# Patient Record
Sex: Male | Born: 1995 | Race: White | Hispanic: No | Marital: Single | Smoking: Current every day smoker
Health system: Southern US, Community
[De-identification: ages and names within clinical notes are randomized; demographics above are authoritative.]

## PROBLEM LIST (undated history)

## (undated) DIAGNOSIS — F419 Anxiety disorder, unspecified: Secondary | ICD-10-CM

## (undated) DIAGNOSIS — F909 Attention-deficit hyperactivity disorder, unspecified type: Secondary | ICD-10-CM

## (undated) HISTORY — DX: Anxiety disorder, unspecified: F41.9

## (undated) HISTORY — DX: Attention-deficit hyperactivity disorder, unspecified type: F90.9

---
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
## 2014-02-27 ENCOUNTER — Other Ambulatory Visit (HOSPITAL_COMMUNITY): Payer: Self-pay | Admitting: Psychiatry

## 2014-08-04 ENCOUNTER — Emergency Department
Admission: EM | Admit: 2014-08-04 | Discharge: 2014-08-04 | Disposition: A | Discharge status: Home / Self Care | Payer: BC Managed Care – PPO | Source: Home / Self Care | Attending: Emergency Medicine | Admitting: Emergency Medicine

## 2014-08-04 ENCOUNTER — Ambulatory Visit (HOSPITAL_COMMUNITY): Payer: Self-pay | Admitting: Psychiatry

## 2014-08-04 ENCOUNTER — Encounter: Payer: Self-pay | Admitting: *Deleted

## 2014-08-04 DIAGNOSIS — F3340 Major depressive disorder, recurrent, in remission, unspecified: Secondary | ICD-10-CM

## 2014-08-04 MED ORDER — LAMOTRIGINE 200 MG PO TABS: ORAL_TABLET | ORAL | Status: DC

## 2014-08-04 NOTE — ED Provider Notes (Addendum)
CSN: [id number]     Arrival date & time 08/04/14  1719 History   First MD Initiated Contact with Patient 08/04/14 1721     Chief Complaint  Patient presents with  . Medication Refill   The history is provided by the patient (and father).  Stephen Kidd is a 18 year-old male, brought in by his father,  [facility], Friday 08/04/14  5:19 PM.  HPI: Special circumstances note: Patient has a history of major depressive disorder/prior substance abuse,and he has been incarcerated this year, and just was accepted into [place] 24 month [place] [facility] by [employer].  I was contacted earlier this afternoon by staff at [facility]. It was explained to me that [facility] had [facility],urgently needing medication refill and forms completed in order to meet stringent legal requirements to enter rehab program tomorrow, and if pt does not meet those requirements, then he would be required to return to incarceration. It was further explained to me (by [facility] staff) the logistical problem that patient would not be able to arrive in time to [facility] during their office hours today, and there was no psychiatrist available after hours to do this.--Therefore, in this special, urgent situation, I was requested to see patient in urgent care, refill his medication, and complete forms.   I carefully reviewed his  EMR and the most recent notes in the EMR, last seen by Dr. Laury DeepPuthuvel for major depressive disorder 09/23/13. Based on the EMR, phone conversation with Selena BattenKim at behavioral health, and careful history taking with patient and father, the following is a summary of his medication history this past year: As of 09/23/2013, he was prescribed lithium, Zoloft, and Tenex, but over a course of months, Lamictal was added in low dose, and gradually increased, and he was gradually weaned off of  Lithium, Zoloft, and  Tenex.  I questioned patient and father several times in different ways, and they confirmed each time that patient is currently taking Lamictal  200 mg twice a day.  Now that he has been on monotherapy with Lamictal 200 mg BID for several months during his incarceration, he is doing well without side effects. He feels his mood and concentration are much improved on this regimen without side effects. He denies any rash, focal neurologic symptoms or any side effects. See review of systems below.  As Stephen Kidd gives his history, his thoughts sound organized and his speech is articulate. Stephen Kidd's father's behavior here is appropriate. Father is helpful with history, is articulate, and appears caring and very concerned for his son's well-being.  Patient has had screening and monitoring blood tests in the past, including initial CBC, CMP and TSH which were all within normal limits,and follow-up CMP and CBC within normal limits on medication.--father and patient declined any blood tests today.  Past Medical History  Diagnosis Date  . ADHD (attention deficit hyperactivity disorder)   . Anxiety       Major Depressive Disorder  History reviewed. No pertinent past surgical history. Family History  Problem Relation Age of Onset  . Diabetes Father   . Depression Sister   . Cancer Paternal Grandmother     hx. of breast cancer  . Depression Mother   . Anxiety disorder Mother   . Stroke Paternal Grandfather   . Transient ischemic attack Paternal Grandfather   . Alcohol abuse Neg Hx   . Physical abuse Neg Hx   .  Bipolar disorder Neg Hx    History  Substance Use Topics  . Smoking status: Current Every Day Smoker -- 0.50 packs/day    Types: Cigarettes  . Smokeless tobacco: Never Used  . Alcohol Use: No     Comment: 4 beers    Review of Systems  Constitutional: Negative for fever.  HENT: Negative.   Eyes: Negative.  Negative for visual disturbance.  Respiratory: Negative.   Cardiovascular:  Negative.   Gastrointestinal: Negative.   Genitourinary: Negative.   Skin: Negative for rash.  Neurological: Negative.  Negative for dizziness, seizures and syncope.  Psychiatric/Behavioral: Negative for suicidal ideas, hallucinations, confusion and self-injury.       No homicidal or suicidal ideation  All other systems reviewed and are negative.   Allergies  Poison ivy treatments  Home Medications   Prior to Admission medications   Medication Sig Start Date End Date Taking? Authorizing Provider  lamoTRIgine (LAMICTAL) 200 MG tablet Take 2 tablets by mouth daily 08/04/14   Lajean Manesavid Massey, MD   BP 133/81 mmHg  Pulse 101  Resp 14  Ht 5\' 7"  (1.702 m)  Wt 164 lb (74.39 kg)  BMI 25.68 kg/m2  SpO2 97% Physical Exam  Constitutional: He is oriented to person, place, and time. He appears well-developed and well-nourished. No distress.  Alert, cooperative male, no distress. Pulse repeated, 92, regular  HENT:  Head: Normocephalic and atraumatic.  Eyes: Conjunctivae and EOM are normal. Pupils are equal, round, and reactive to light. No scleral icterus.  Neck: Normal range of motion.  Cardiovascular: Normal rate.   Pulmonary/Chest: Effort normal.  Abdominal: He exhibits no distension.  Musculoskeletal: Normal range of motion.  Neurological: He is alert and oriented to person, place, and time. No cranial nerve deficit. Coordination and gait normal.  Skin: Skin is warm. No rash noted.  Psychiatric: He has a normal mood and affect. His speech is normal and behavior is normal. Thought content normal. Thought content is not delusional. Cognition and memory are normal. He expresses no homicidal and no suicidal ideation.  Nursing note and vitals reviewed.   ED Course  Procedures   MDM   1. Major depressive disorder, recurrent, in remission   Over 40 minutes spent, greater than 50% of the time spent for counseling and coordination of care. Risks, benefits, alternatives discussed at length  with patient and father. Precautions discussed. They voiced understanding and agreement with the following plan:  I carefully reviewed and discussed with patient and father the 2 forms they bring in from [employer], entitled "Medication Guidelines" and "Self Administration Order", which we will scan into patient's EMR. Requirements include: "standing order from the prescribing doctor( that exactly matches the Rx label on the bottle)",... "...frequency of dose needs to read daily, NOT twice daily" .........they require "a 90 day supply of the medication and a 4155-month prescription..."  I therefore printed and gave them a prescription for a 90 day supply: lamoTRIgine (LAMICTAL) 200 MG tablet Take 2 tablets by mouth daily 180 tablet   With 3 (Three) additional refills. I included in the prescription comments: "for any questions or concerns about this prescription, please contact behavioral [facility] at [phone]"  Please see detailed discussion in HPI, and patient confirmed that he is to take the Lamictal  200 mg twice a day, as he is currently taking. I completed, signed, and gave them the "Self Administration Order" sheet.  Patient and father voiced understanding that Stephen Kidd needs to follow up with  psychiatrist at behavioral health, [facility] within 3 to 6 months, or sooner if any questions,problems or concerns. We will forward a copy of this note and forms to the psychiatry staff at behavioral health, [facility].                Lajean Manes, MD 08/05/14 [phone]

## 2014-08-04 NOTE — ED Notes (Signed)
Pt is in need of medication refill for Lamictal. Dr. Georgina [facility]illionMassey [facility] and ok 'd visit.

## 2014-09-07 ENCOUNTER — Encounter (HOSPITAL_COMMUNITY): Payer: Self-pay

## 2014-09-07 ENCOUNTER — Emergency Department (HOSPITAL_COMMUNITY)
Admission: EM | Admit: 2014-09-07 | Discharge: 2014-09-07 | Disposition: A | Discharge status: Home / Self Care | Payer: BLUE CROSS/BLUE SHIELD | Attending: Emergency Medicine | Admitting: Emergency Medicine

## 2014-09-07 ENCOUNTER — Emergency Department (HOSPITAL_COMMUNITY): Payer: BLUE CROSS/BLUE SHIELD

## 2014-09-07 DIAGNOSIS — G43109 Migraine with aura, not intractable, without status migrainosus: Secondary | ICD-10-CM | POA: Diagnosis not present

## 2014-09-07 DIAGNOSIS — Z720 Tobacco use: Secondary | ICD-10-CM | POA: Diagnosis not present

## 2014-09-07 DIAGNOSIS — R51 Headache: Secondary | ICD-10-CM | POA: Diagnosis present

## 2014-09-07 DIAGNOSIS — R519 Headache, unspecified: Secondary | ICD-10-CM

## 2014-09-07 DIAGNOSIS — Z79899 Other long term (current) drug therapy: Secondary | ICD-10-CM | POA: Insufficient documentation

## 2014-09-07 DIAGNOSIS — F419 Anxiety disorder, unspecified: Secondary | ICD-10-CM | POA: Diagnosis not present

## 2014-09-07 DIAGNOSIS — H539 Unspecified visual disturbance: Secondary | ICD-10-CM

## 2014-09-07 MED ORDER — METOCLOPRAMIDE HCL 5 MG/ML IJ SOLN
10.0000 mg | Freq: Once | INTRAMUSCULAR | Status: AC
  Administered 2014-09-07: 10 mg via INTRAVENOUS
  Filled 2014-09-07: qty 2

## 2014-09-07 MED ORDER — SODIUM CHLORIDE 0.9 % IV BOLUS (SEPSIS)
1000.0000 mL | Freq: Once | INTRAVENOUS | Status: AC
Start: 2014-09-07 — End: 2014-09-07
  Administered 2014-09-07: 1000 mL via INTRAVENOUS

## 2014-09-07 MED ORDER — IBUPROFEN 800 MG PO TABS: 800.0000 mg | ORAL_TABLET | Freq: Three times a day (TID) | ORAL | Status: DC | PRN

## 2014-09-07 MED ORDER — DIPHENHYDRAMINE HCL 50 MG/ML IJ SOLN
50.0000 mg | Freq: Once | INTRAMUSCULAR | Status: AC
  Administered 2014-09-07: 50 mg via INTRAVENOUS
  Filled 2014-09-07: qty 1

## 2014-09-07 MED ORDER — KETOROLAC TROMETHAMINE 30 MG/ML IJ SOLN
30.0000 mg | Freq: Once | INTRAMUSCULAR | Status: AC
  Administered 2014-09-07: 30 mg via INTRAVENOUS
  Filled 2014-09-07: qty 1

## 2014-09-07 MED ORDER — DEXAMETHASONE SODIUM PHOSPHATE 4 MG/ML IJ SOLN
10.0000 mg | Freq: Once | INTRAMUSCULAR | Status: AC
  Administered 2014-09-07: 10 mg via INTRAVENOUS
  Filled 2014-09-07: qty 3

## 2014-09-07 NOTE — ED Notes (Signed)
Pt reports area behind right ear started to be painful to touch yesterday. No redness or abscess noted to area. Pt also reports intermittent blurred vision to right eyes starting today. Denies at this time. Neuro intact. NAD.
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
## 2014-09-07 NOTE — Discharge Instructions (Signed)
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
## 2014-09-07 NOTE — ED Notes (Signed)
Pt A&OX4, ambulatory at d/c with steady gait, NAD 

## 2014-09-15 ENCOUNTER — Encounter (HOSPITAL_COMMUNITY): Payer: Self-pay | Admitting: Neurology

## 2014-09-15 ENCOUNTER — Emergency Department (HOSPITAL_COMMUNITY)
Admission: EM | Admit: 2014-09-15 | Discharge: 2014-09-15 | Disposition: A | Discharge status: Home / Self Care | Payer: BLUE CROSS/BLUE SHIELD | Attending: Emergency Medicine | Admitting: Emergency Medicine

## 2014-09-15 ENCOUNTER — Emergency Department (HOSPITAL_COMMUNITY): Payer: BLUE CROSS/BLUE SHIELD

## 2014-09-15 DIAGNOSIS — R0602 Shortness of breath: Secondary | ICD-10-CM | POA: Diagnosis not present

## 2014-09-15 DIAGNOSIS — F419 Anxiety disorder, unspecified: Secondary | ICD-10-CM | POA: Insufficient documentation

## 2014-09-15 DIAGNOSIS — Z720 Tobacco use: Secondary | ICD-10-CM | POA: Insufficient documentation

## 2014-09-15 DIAGNOSIS — R55 Syncope and collapse: Secondary | ICD-10-CM | POA: Diagnosis not present

## 2014-09-15 DIAGNOSIS — R112 Nausea with vomiting, unspecified: Secondary | ICD-10-CM | POA: Insufficient documentation

## 2014-09-15 DIAGNOSIS — R0789 Other chest pain: Secondary | ICD-10-CM | POA: Diagnosis not present

## 2014-09-15 DIAGNOSIS — R000 Tachycardia, unspecified: Secondary | ICD-10-CM | POA: Diagnosis not present

## 2014-09-15 DIAGNOSIS — Z79899 Other long term (current) drug therapy: Secondary | ICD-10-CM | POA: Diagnosis not present

## 2014-09-15 DIAGNOSIS — R079 Chest pain, unspecified: Secondary | ICD-10-CM | POA: Diagnosis present

## 2014-09-15 LAB — BASIC METABOLIC PANEL
Anion gap: 6 (ref 5–15)
BUN: 8 mg/dL (ref 6–23)
CALCIUM: 9.5 mg/dL (ref 8.4–10.5)
CHLORIDE: 107 mmol/L (ref 96–112)
CO2: 26 mmol/L (ref 19–32)
Creatinine, Ser: 1 mg/dL (ref 0.50–1.35)
GFR calc Af Amer: >90 mL/min (ref >90)
GFR calc non Af Amer: >90 mL/min (ref >90)
Glucose, Bld: 99 mg/dL (ref 70–99)
POTASSIUM: 3.6 mmol/L (ref 3.5–5.1)
Sodium: 139 mmol/L (ref 135–145)

## 2014-09-15 LAB — URINALYSIS, ROUTINE W REFLEX MICROSCOPIC
Bilirubin Urine: NEGATIVE
Glucose, UA: NEGATIVE mg/dL
Hgb urine dipstick: NEGATIVE
Ketones, ur: NEGATIVE mg/dL
LEUKOCYTES UA: NEGATIVE
Nitrite: NEGATIVE
PH: 7 (ref 5.0–8.0)
PROTEIN: NEGATIVE mg/dL
SPECIFIC GRAVITY, URINE: 1.009 (ref 1.005–1.030)
Urobilinogen, UA: 0.2 mg/dL (ref 0.0–1.0)

## 2014-09-15 LAB — CBC WITH DIFFERENTIAL/PLATELET
BASOS ABS: 0 10*3/uL (ref 0.0–0.1)
BASOS PCT: 0 % (ref 0–1)
Eosinophils Absolute: 0 10*3/uL (ref 0.0–0.7)
Eosinophils Relative: 0 % (ref 0–5)
HCT: 44 % (ref 39.0–52.0)
Hemoglobin: 15.4 g/dL (ref 13.0–17.0)
Lymphocytes Relative: 12 % (ref 12–46)
Lymphs Abs: 1.3 10*3/uL (ref 0.7–4.0)
MCH: 30.2 pg (ref 26.0–34.0)
MCHC: 35 g/dL (ref 30.0–36.0)
MCV: 86.3 fL (ref 78.0–100.0)
MONOS PCT: 5 % (ref 3–12)
Monocytes Absolute: 0.5 10*3/uL (ref 0.1–1.0)
NEUTROS PCT: 83 % — AB (ref 43–77)
Neutro Abs: 9 10*3/uL — ABNORMAL HIGH (ref 1.7–7.7)
Platelets: 235 10*3/uL (ref 150–400)
RBC: 5.1 MIL/uL (ref 4.22–5.81)
RDW: 12.3 % (ref 11.5–15.5)
WBC: 10.9 10*3/uL — ABNORMAL HIGH (ref 4.0–10.5)

## 2014-09-15 LAB — I-STAT TROPONIN, ED: Troponin i, poc: 0 ng/mL (ref 0.00–0.08)

## 2014-09-15 LAB — D-DIMER, QUANTITATIVE (NOT AT ARMC): D-Dimer, Quant: <0.27 ug/mL-FEU (ref 0.00–0.48)

## 2014-09-15 MED ORDER — ONDANSETRON HCL 4 MG/2ML IJ SOLN
4.0000 mg | Freq: Once | INTRAMUSCULAR | Status: AC
  Administered 2014-09-15: 4 mg via INTRAVENOUS
  Filled 2014-09-15: qty 2

## 2014-09-15 MED ORDER — SODIUM CHLORIDE 0.9 % IV BOLUS (SEPSIS)
1000.0000 mL | Freq: Once | INTRAVENOUS | Status: AC
  Administered 2014-09-15: 1000 mL via INTRAVENOUS

## 2014-09-15 MED ORDER — ACETAMINOPHEN 500 MG PO TABS
1000.0000 mg | ORAL_TABLET | Freq: Once | ORAL | Status: AC
  Administered 2014-09-15: 1000 mg via ORAL
  Filled 2014-09-15: qty 2

## 2014-09-15 NOTE — ED Notes (Addendum)
Pt returned from xray

## 2014-09-15 NOTE — ED Provider Notes (Signed)
CSN: [id number]     Arrival date & time 09/15/14  7829 History   First MD Initiated Contact with Patient 09/15/14 0930     Chief Complaint  Patient presents with  . Chest Pain     (Consider location/radiation/quality/duration/timing/severity/associated sxs/prior Treatment) HPI Stephen Kidd is a 19 year old male with past medical history of ADHD, anxiety who presents the ER complaining of left upper chest pain and syncopal episode. Patient reports he was at work at the [employer], would be he became very stressed out, and angry. He states when he had these feelings he began to have sharp pains in his upper left chest, and began to have a headache. Patient states he shortly after passed out, and after waking up states he asked his friend to take him to the hospital. He denies any injury from his syncopal episode, and states he is still having persistent left upper "sharp" chest pain which is aggravated with deep inspiration and with palpation. He reports no alleviating factors. He reports mild associated nausea chest persisted, and reports his headache is in the posterior aspect of his head, and feels consistent with migraines he has had in the past. Patient states he has experienced complex migraines in the past. Patient reports also some associated generalized weakness. Patient states he is having similar chest pains in the past, and has been getting them over the past week intermittently. He states he typically notices a "fluttering sensation" when he lies down at night. Patient reports associated shortness of breath, which he states only has experienced just prior to his syncopal episode. Patient states the shortness of breath felt similar to previous anxiety attacks he has had in the past.  Past Medical History  Diagnosis Date  . ADHD (attention deficit hyperactivity disorder)   . Anxiety    History reviewed. No pertinent past surgical history. Family History  Problem Relation Age of  Onset  . Diabetes Father   . Depression Sister   . Cancer Paternal Grandmother     hx. of breast cancer  . Depression Mother   . Anxiety disorder Mother   . Stroke Paternal Grandfather   . Transient ischemic attack Paternal Grandfather   . Alcohol abuse Neg Hx   . Physical abuse Neg Hx   . Bipolar disorder Neg Hx    History  Substance Use Topics  . Smoking status: Current Every Day Smoker -- 0.50 packs/day    Types: Cigarettes  . Smokeless tobacco: Never Used  . Alcohol Use: No     Comment: 4 beers    Review of Systems  Constitutional: Negative for fever.  HENT: Negative for trouble swallowing.   Eyes: Negative for visual disturbance.  Respiratory: Positive for shortness of breath.   Cardiovascular: Positive for chest pain.  Gastrointestinal: Positive for nausea and vomiting. Negative for abdominal pain.  Genitourinary: Negative for dysuria.  Musculoskeletal: Negative for neck pain.  Skin: Negative for rash.  Neurological: Positive for syncope. Negative for dizziness, weakness and numbness.  Psychiatric/Behavioral: Negative.       Allergies  Poison ivy treatments  Home Medications   Prior to Admission medications   Medication Sig Start Date End Date Taking? Authorizing Provider  ARIPiprazole (ABILIFY) 10 MG tablet Take 10 mg by mouth daily.    Historical Provider, MD  doxepin (SINEQUAN) 50 MG capsule Take 50 mg by mouth at bedtime.    Historical Provider, MD  ibuprofen (ADVIL,MOTRIN) 800 MG tablet Take 1 tablet (800 mg total) by mouth every 8 (  eight) hours as needed for mild pain. 09/07/14   Kristen N Ward, DO  lamoTRIgine (LAMICTAL) 200 MG tablet Take 2 tablets by mouth daily 08/04/14   Lajean Manes, MD   BP 117/75 mmHg  Pulse 90  Temp(Src) 99.1 F (37.3 C) (Oral)  Resp 13  Ht  (1.727 m)  Wt 165 lb (74.844 kg)  BMI 25.09 kg/m2  SpO2 93% Physical Exam  Constitutional: He is oriented to person, place, and time. He appears well-developed and  well-nourished. No distress.  HENT:  Head: Normocephalic and atraumatic.  Mouth/Throat: Oropharynx is clear and moist. No oropharyngeal exudate.  Eyes: Right eye exhibits no discharge. Left eye exhibits no discharge. No scleral icterus.  Neck: Normal range of motion.  Cardiovascular: Normal rate, regular rhythm, S1 normal, S2 normal and normal heart sounds.   No murmur heard. Pulses:      Radial pulses are 2+ on the right side, and 2+ on the left side.  Tachycardic at 105 on exam  Pulmonary/Chest: Effort normal and breath sounds normal. No accessory muscle usage. No tachypnea. No respiratory distress.    Well-healed scar noted on upper left chest, which pain is reproducible in this region.  Abdominal: Soft. Normal appearance. There is no tenderness. There is no rigidity, no guarding, no tenderness at McBurney's point and negative Murphy's sign.  Musculoskeletal: Normal range of motion. He exhibits no edema or tenderness.  Neurological: He is alert and oriented to person, place, and time. He has normal strength. No cranial nerve deficit or sensory deficit. Coordination normal.  Patient fully alert, answering questions appropriately in full, clear sentences. Motor strength 5 out of 5 in all major muscle groups of upper and lower extremities. Radial nerves II through XII grossly intact. Distal sensation intact.  Skin: Skin is warm and dry. No rash noted. He is not diaphoretic.  Psychiatric: He has a normal mood and affect.  Nursing note and vitals reviewed.   ED Course  Procedures (including critical care time) Labs Review Labs Reviewed  CBC WITH DIFFERENTIAL/PLATELET - Abnormal; Notable for the following:    WBC 10.9 (*)    Neutrophils Relative % 83 (*)    Neutro Abs 9.0 (*)    All other components within normal limits  BASIC METABOLIC PANEL  URINALYSIS, ROUTINE W REFLEX MICROSCOPIC  D-DIMER, QUANTITATIVE  I-STAT TROPOININ, ED    Imaging Review Dg Chest 2 View  09/15/2014    CLINICAL DATA:  Syncopal episode at work today. Left-sided chest pain.  EXAM: CHEST  2 VIEW  COMPARISON:  None.  FINDINGS: The heart size and mediastinal contours are within normal limits. Both lungs are clear. The visualized skeletal structures are unremarkable.  IMPRESSION: Normal chest x-ray.   Electronically Signed   By: Loralie Champagne M.D.   On: 09/15/2014 10:59     EKG Interpretation   Date/Time:  Friday September 15 2014 09:23:33 EST Ventricular Rate:  103 PR Interval:  144 QRS Duration: 100 QT Interval:  324 QTC Calculation: 424 R Axis:   87 Text Interpretation:  Sinus tachycardia Right atrial enlargement  Incomplete right bundle branch block Borderline ECG No old tracing to  compare Confirmed by KOHUT  MD, STEPHEN ([id number]) on 09/15/2014 11:33:36 AM      MDM   Final diagnoses:  Chest wall discomfort    Patient is to be discharged with recommendation to follow up with PCP in regards to today's hospital visit. Chest pain is not likely of cardiac or pulmonary etiology d/t  presentation, d-dimer negative, VSS, no tracheal deviation, no JVD or new murmur, RRR, breath sounds equal bilaterally, EKG without acute abnormalities, negative troponin, and negative CXR. No concern for ACS. No concern for PE. Based on [place] syncope rule, patient is at low risk for serious outcome. Patient's chest pain most likely musculoskeletal in nature due to the fact that it is tender to palpation and with inspiration. Patient's heart rate decreased into the 90s after fluid bolus. Patient also describing he started doing a new type of pushups which could cause some pain and tenderness in his upper lateral pectoralis region. Patient encouraged to return to the ER if his CP becomes exertional, associated with diaphoresis or nausea, radiates to left jaw/arm, worsens or becomes concerning in any way. Pt appears reliable for follow up and is agreeable to discharge. I discussed return precautions with patient, and  patient verbalizes understanding and agreement with this plan. I encouraged patient to call or return to ER should he have any questions or concerns.  BP 117/75 mmHg  Pulse 90  Temp(Src) 99.1 F (37.3 C) (Oral)  Resp 13  Ht 5\' 8"  (1.727 m)  Wt 165 lb (74.844 kg)  BMI 25.09 kg/m2  SpO2 93%  Signed,  Ladona MowJoe Robina Hamor, PA-C 6:09 PM  Patient discussed with Dr. Raeford RazorStephen Kohut, M.D.     Monte FantasiaJoseph W Hershal Eriksson, PA-C 09/15/14 1809  Raeford RazorStephen Kohut, MD 09/18/14 [phone]

## 2014-09-15 NOTE — ED Notes (Signed)
Joe PA at beside.

## 2014-09-15 NOTE — Discharge Instructions (Signed)
Follow-up with your primary care doctor. Return to the ER with any high fever, severe chest pain, syncope, shortness of breath, nausea, vomiting, worsening of symptoms.   Chest Pain (Nonspecific) It is often hard to give a specific diagnosis for the cause of chest pain. There is always a chance that your pain could be related to something serious, such as a heart attack or a blood clot in the lungs. You need to follow up with your health care provider for further evaluation. CAUSES   Heartburn.  Pneumonia or bronchitis.  Anxiety or stress.  Inflammation around your heart (pericarditis) or lung (pleuritis or pleurisy).  A blood clot in the lung.  A collapsed lung (pneumothorax). It can develop suddenly on its own (spontaneous pneumothorax) or from trauma to the chest.  Shingles infection (herpes zoster virus). The chest wall is composed of bones, muscles, and cartilage. Any of these can be the source of the pain.  The bones can be bruised by injury.  The muscles or cartilage can be strained by coughing or overwork.  The cartilage can be affected by inflammation and become sore (costochondritis). DIAGNOSIS  Lab tests or other studies may be needed to find the cause of your pain. Your health care provider may have you take a test called an ambulatory electrocardiogram (ECG). An ECG records your heartbeat patterns over a 24-hour period. You may also have other tests, such as:  Transthoracic echocardiogram (TTE). During echocardiography, sound waves are used to evaluate how blood flows through your heart.  Transesophageal echocardiogram (TEE).  Cardiac monitoring. This allows your health care provider to monitor your heart rate and rhythm in real time.  Holter monitor. This is a portable device that records your heartbeat and can help diagnose heart arrhythmias. It allows your health care provider to track your heart activity for several days, if needed.  Stress tests by exercise or  by giving medicine that makes the heart beat faster. TREATMENT   Treatment depends on what may be causing your chest pain. Treatment may include:  Acid blockers for heartburn.  Anti-inflammatory medicine.  Pain medicine for inflammatory conditions.  Antibiotics if an infection is present.  You may be advised to change lifestyle habits. This includes stopping smoking and avoiding alcohol, caffeine, and chocolate.  You may be advised to keep your head raised (elevated) when sleeping. This reduces the chance of acid going backward from your stomach into your esophagus. Most of the time, nonspecific chest pain will improve within 2-3 days with rest and mild pain medicine.  HOME CARE INSTRUCTIONS   If antibiotics were prescribed, take them as directed. Finish them even if you start to feel better.  For the next few days, avoid physical activities that bring on chest pain. Continue physical activities as directed.  Do not use any tobacco products, including cigarettes, chewing tobacco, or electronic cigarettes.  Avoid drinking alcohol.  Only take medicine as directed by your health care provider.  Follow your health care provider's suggestions for further testing if your chest pain does not go away.  Keep any follow-up appointments you made. If you do not go to an appointment, you could develop lasting (chronic) problems with pain. If there is any problem keeping an appointment, call to reschedule. SEEK MEDICAL CARE IF:   Your chest pain does not go away, even after treatment.  You have a rash with blisters on your chest.  You have a fever. SEEK IMMEDIATE MEDICAL CARE IF:   You have increased chest  pain or pain that spreads to your arm, neck, jaw, back, or abdomen.  You have shortness of breath.  You have an increasing cough, or you cough up blood.  You have severe back or abdominal pain.  You feel nauseous or vomit.  You have severe weakness.  You faint.  You have  chills. This is an emergency. Do not wait to see if the pain will go away. Get medical help at once. Call your local emergency services (911 in [place] Do not drive yourself to the hospital. MAKE SURE YOU:   Understand these instructions.  Will watch your condition.  Will get help right away if you are not doing well or get worse. Document Released: 05/14/2005 Document Revised: 08/09/2013 Document Reviewed: 03/09/2008 [facility] Patient Information 2015 [place], [place]. This information is not intended to replace advice given to you by your health care provider. Make sure you discuss any questions you have with your health care provider.  Chest Wall Pain Chest wall pain is pain in or around the bones and muscles of your chest. It may take up to 6 weeks to get better. It may take longer if you must stay physically active in your work and activities.  CAUSES  Chest wall pain may happen on its own. However, it may be caused by:  A viral illness like the flu.  Injury.  Coughing.  Exercise.  Arthritis.  Fibromyalgia.  Shingles. HOME CARE INSTRUCTIONS   Avoid overtiring physical activity. Try not to strain or perform activities that cause pain. This includes any activities using your chest or your abdominal and side muscles, especially if heavy weights are used.  Put ice on the sore area.  Put ice in a plastic bag.  Place a towel between your skin and the bag.  Leave the ice on for 15-20 minutes per hour while awake for the first 2 days.  Only take over-the-counter or prescription medicines for pain, discomfort, or fever as directed by your caregiver. SEEK IMMEDIATE MEDICAL CARE IF:   Your pain increases, or you are very uncomfortable.  You have a fever.  Your chest pain becomes worse.  You have new, unexplained symptoms.  You have nausea or vomiting.  You feel sweaty or lightheaded.  You have a cough with phlegm (sputum), or you cough up blood. MAKE SURE YOU:    Understand these instructions.  Will watch your condition.  Will get help right away if you are not doing well or get worse. Document Released: 08/04/2005 Document Revised: 10/27/2011 Document Reviewed: 03/31/2011 [facility] Patient Information 2015 [place], [place]. This information is not intended to replace advice given to you by your health care provider. Make sure you discuss any questions you have with your health care provider.

## 2014-09-15 NOTE — ED Notes (Signed)
Pt comfortable with discharge and follow up instructions. Pt declines wheelchair, escorted to waiting area by this RN. No prescriptions. 

## 2014-09-15 NOTE — ED Notes (Signed)
Reports today he was playing with cats at [employer], tasted blood, then got CP, then passed out. Witnessed by friend who reports he was unconcsious for 5 mins. Denies cardiac problems. No head trauma.Is a x 4

## 2014-09-22 ENCOUNTER — Emergency Department (INDEPENDENT_AMBULATORY_CARE_PROVIDER_SITE_OTHER)
Admission: EM | Admit: 2014-09-22 | Discharge: 2014-09-22 | Disposition: A | Discharge status: Home / Self Care | Payer: BLUE CROSS/BLUE SHIELD | Source: Home / Self Care | Attending: Emergency Medicine | Admitting: Emergency Medicine

## 2014-09-22 ENCOUNTER — Encounter (HOSPITAL_COMMUNITY): Payer: Self-pay | Admitting: Emergency Medicine

## 2014-09-22 DIAGNOSIS — L905 Scar conditions and fibrosis of skin: Secondary | ICD-10-CM

## 2014-09-22 MED ORDER — IBUPROFEN 800 MG PO TABS: 800.0000 mg | ORAL_TABLET | Freq: Three times a day (TID) | ORAL | Status: AC | PRN

## 2014-09-22 MED ORDER — CLOBETASOL PROPIONATE 0.05 % EX CREA: 1.0000 "application " | TOPICAL_CREAM | Freq: Two times a day (BID) | CUTANEOUS | Status: AC

## 2014-09-22 NOTE — Discharge Instructions (Signed)
The pain and numbness you have is due to scar tissue. Apply the clobetasol cream twice a day for 1 week.  Use this every other week. Take ibuprofen 800 mg every 8 hours as needed for pain. Continue to use your arm as much as you can, but no heavy lifting. Follow-up as needed.

## 2014-09-22 NOTE — ED Notes (Signed)
Pt states he has throbbing pain in his right forearm that becomes shooting pain any time he moves it.  Pt has a 6 month cutting injury scar on this arm

## 2014-09-22 NOTE — ED Provider Notes (Signed)
CSN: [id number]     Arrival date & time 09/22/14  1742 History   First MD Initiated Contact with Patient 09/22/14 1808     Chief Complaint  Patient presents with  . Arm Pain    right   (Consider location/radiation/quality/duration/timing/severity/associated sxs/prior Treatment) HPI  He is a pleasant 19 year old man here for evaluation of right arm pain and numbness. He reports a cutting injury 6 months ago to the right dorsal forearm.  He states he cut to the bone. About one month ago he started noticing numbness around the scar as well as an aching pain. He will have a sharp pain that radiates up his arm when he extends his wrist. No weakness.  Past Medical History  Diagnosis Date  . ADHD (attention deficit hyperactivity disorder)   . Anxiety    History reviewed. No pertinent past surgical history. Family History  Problem Relation Age of Onset  . Diabetes Father   . Depression Sister   . Cancer Paternal Grandmother     hx. of breast cancer  . Depression Mother   . Anxiety disorder Mother   . Stroke Paternal Grandfather   . Transient ischemic attack Paternal Grandfather   . Alcohol abuse Neg Hx   . Physical abuse Neg Hx   . Bipolar disorder Neg Hx    History  Substance Use Topics  . Smoking status: Current Every Day Smoker -- 2.00 packs/day    Types: Cigarettes  . Smokeless tobacco: Never Used  . Alcohol Use: No     Comment: 4 beers    Review of Systems As in history of present illness Allergies  Poison ivy extract  Home Medications   Prior to Admission medications   Medication Sig Start Date End Date Taking? Authorizing Provider  ARIPiprazole (ABILIFY) 10 MG tablet Take 10 mg by mouth daily.   Yes Historical Provider, MD  doxepin (SINEQUAN) 50 MG capsule Take 50 mg by mouth at bedtime.   Yes Historical Provider, MD  lamoTRIgine (LAMICTAL) 200 MG tablet Take 2 tablets by mouth daily 08/04/14  Yes Lajean Manes, MD  clobetasol cream (TEMOVATE) 0.05 % Apply 1  application topically 2 (two) times daily. Use every other week. 09/22/14   Charm Rings, MD  ibuprofen (ADVIL,MOTRIN) 800 MG tablet Take 1 tablet (800 mg total) by mouth every 8 (eight) hours as needed for mild pain. 09/22/14   Charm Rings, MD   BP 111/63 mmHg  Pulse 80  Temp(Src) 98.6 F (37 C) (Oral)  SpO2 96% Physical Exam  Constitutional: He is oriented to person, place, and time. He appears well-developed and well-nourished. No distress.  Cardiovascular: Normal rate.   Pulmonary/Chest: Effort normal.  Neurological: He is alert and oriented to person, place, and time.  Skin:  5 cm x 2 cm scar on right dorsal forearm. 2 cm surrounding numbness.  Scar is slightly tender to palpation. He has 5 out of 5 grip strength, wrist extension, wrist flexion. There is pain with wrist extension.    ED Course  Procedures (including critical care time) Labs Review Labs Reviewed - No data to display  Imaging Review No results found.   MDM   1. Scar tissue    Pain and numbness is likely secondary to developing scar tissue from his cutting injury. We will attempt to modulate the scar modeling with every other week clobetasol cream. Ibuprofen 800 mg provided to use as needed for pain. Discussed that he should continue to use the arm, but  no heavy lifting. Follow-up as needed.    Charm RingsErin J Honig, MD 09/22/14 [phone]

## 2014-10-16 ENCOUNTER — Emergency Department (HOSPITAL_COMMUNITY)
Admission: EM | Admit: 2014-10-16 | Discharge: 2014-10-16 | Disposition: A | Discharge status: Home / Self Care | Payer: BLUE CROSS/BLUE SHIELD | Attending: Emergency Medicine | Admitting: Emergency Medicine

## 2014-10-16 ENCOUNTER — Encounter (HOSPITAL_COMMUNITY): Payer: Self-pay | Admitting: Emergency Medicine

## 2014-10-16 DIAGNOSIS — T50904A Poisoning by unspecified drugs, medicaments and biological substances, undetermined, initial encounter: Secondary | ICD-10-CM

## 2014-10-16 DIAGNOSIS — Z79899 Other long term (current) drug therapy: Secondary | ICD-10-CM | POA: Insufficient documentation

## 2014-10-16 DIAGNOSIS — R000 Tachycardia, unspecified: Secondary | ICD-10-CM | POA: Diagnosis not present

## 2014-10-16 DIAGNOSIS — F1210 Cannabis abuse, uncomplicated: Secondary | ICD-10-CM | POA: Insufficient documentation

## 2014-10-16 DIAGNOSIS — T485X1A Poisoning by other anti-common-cold drugs, accidental (unintentional), initial encounter: Secondary | ICD-10-CM | POA: Diagnosis not present

## 2014-10-16 DIAGNOSIS — T483X1A Poisoning by antitussives, accidental (unintentional), initial encounter: Secondary | ICD-10-CM | POA: Diagnosis present

## 2014-10-16 LAB — CBC WITH DIFFERENTIAL/PLATELET
BASOS ABS: 0 10*3/uL (ref 0.0–0.1)
Basophils Relative: 0 % (ref 0–1)
EOS ABS: 0 10*3/uL (ref 0.0–0.7)
Eosinophils Relative: 0 % (ref 0–5)
HCT: 47.8 % (ref 39.0–52.0)
Hemoglobin: 16.7 g/dL (ref 13.0–17.0)
LYMPHS ABS: 1.9 10*3/uL (ref 0.7–4.0)
Lymphocytes Relative: 26 % (ref 12–46)
MCH: 30.9 pg (ref 26.0–34.0)
MCHC: 34.9 g/dL (ref 30.0–36.0)
MCV: 88.5 fL (ref 78.0–100.0)
Monocytes Absolute: 0.9 10*3/uL (ref 0.1–1.0)
Monocytes Relative: 12 % (ref 3–12)
NEUTROS PCT: 62 % (ref 43–77)
Neutro Abs: 4.6 10*3/uL (ref 1.7–7.7)
Platelets: 229 10*3/uL (ref 150–400)
RBC: 5.4 MIL/uL (ref 4.22–5.81)
RDW: 12.4 % (ref 11.5–15.5)
WBC: 7.4 10*3/uL (ref 4.0–10.5)

## 2014-10-16 LAB — COMPREHENSIVE METABOLIC PANEL
ALK PHOS: 68 U/L (ref 39–117)
ALT: 10 U/L (ref 0–53)
AST: 17 U/L (ref 0–37)
Albumin: 4.9 g/dL (ref 3.5–5.2)
Anion gap: 9 (ref 5–15)
BUN: 9 mg/dL (ref 6–23)
CO2: 27 mmol/L (ref 19–32)
Calcium: 9.9 mg/dL (ref 8.4–10.5)
Chloride: 106 mmol/L (ref 96–112)
Creatinine, Ser: 1.07 mg/dL (ref 0.50–1.35)
GFR calc Af Amer: >90 mL/min (ref >90)
Glucose, Bld: 94 mg/dL (ref 70–99)
POTASSIUM: 3.6 mmol/L (ref 3.5–5.1)
Sodium: 142 mmol/L (ref 135–145)
Total Bilirubin: 1 mg/dL (ref 0.3–1.2)
Total Protein: 7.3 g/dL (ref 6.0–8.3)

## 2014-10-16 LAB — RAPID URINE DRUG SCREEN, HOSP PERFORMED
Amphetamines: NOT DETECTED
Barbiturates: NOT DETECTED
Benzodiazepines: NOT DETECTED
Cocaine: NOT DETECTED
OPIATES: NOT DETECTED
TETRAHYDROCANNABINOL: POSITIVE — AB

## 2014-10-16 LAB — ETHANOL: Alcohol, Ethyl (B): <5 mg/dL (ref 0–9)

## 2014-10-16 LAB — ACETAMINOPHEN LEVEL: Acetaminophen (Tylenol), Serum: <10 ug/mL — ABNORMAL LOW (ref 10–30)

## 2014-10-16 LAB — SALICYLATE LEVEL: Salicylate Lvl: <4 mg/dL (ref 2.8–20.0)

## 2014-10-16 MED ORDER — SODIUM CHLORIDE 0.9 % IV BOLUS (SEPSIS)
1000.0000 mL | Freq: Once | INTRAVENOUS | Status: AC
  Administered 2014-10-16: 1000 mL via INTRAVENOUS

## 2014-10-16 NOTE — ED Notes (Signed)
Fredricka BonineLisa, PA-C, discusses plan of care, contacting poison control .

## 2014-10-16 NOTE — Discharge Instructions (Signed)
Recommend discontinuing use of all over the counter cough/cold medications for reasons outside of intended purpose. Follow-up with your primary care physician. Return to the ED for new concerns.

## 2014-10-16 NOTE — ED Notes (Signed)
Lisa, PA-C, at the bedside.  

## 2014-10-16 NOTE — ED Notes (Signed)
Dr. Glick at the bedside.  

## 2014-10-16 NOTE — ED Provider Notes (Signed)
CSN: [id number]     Arrival date & time 10/16/14  [id number] History   First MD Initiated Contact with Patient 10/16/14 [phone]     Chief Complaint  Patient presents with  . Drug Overdose     (Consider location/radiation/quality/duration/timing/severity/associated sxs/prior Treatment) The history is provided by the patient and medical records.    This is a 19 y.o. M with PMH significant for ADHD, anxiety, presenting to the ED for drug overdose.  Patient states he took 3 boxes of coridicin HBP (approx 40 tablets) and 1 bottle of delysym.  Initial ingestion was around 2300 and subsequent ingestion around 0500.  Patient states he was trying to get "high".  He denies suicidal ideation or suicidal intentions.  He states he only came here because he felt he "overdid it" by taking 3 boxes of Coricidin instead of his usual 2.  He admits he has done this multiple times in the past.  Denies recent EtOH use.  Admits to occasional marijuana use, none within the past 24 hours.  On arrival, patient states he feels lightheaded and somewhat nauseated.  No vomiting PTA.  Tachycardia noted on arrival, VS otherwise stable.  Past Medical History  Diagnosis Date  . ADHD (attention deficit hyperactivity disorder)   . Anxiety    No past surgical history on file. Family History  Problem Relation Age of Onset  . Diabetes Father   . Depression Sister   . Cancer Paternal Grandmother     hx. of breast cancer  . Depression Mother   . Anxiety disorder Mother   . Stroke Paternal Grandfather   . Transient ischemic attack Paternal Grandfather   . Alcohol abuse Neg Hx   . Physical abuse Neg Hx   . Bipolar disorder Neg Hx    History  Substance Use Topics  . Smoking status: Current Every Day Smoker -- 2.00 packs/day    Types: Cigarettes  . Smokeless tobacco: Never Used  . Alcohol Use: No     Comment: 4 beers    Review of Systems  Psychiatric/Behavioral:       Drug overdose  All other systems reviewed and are  negative.     Allergies  Poison ivy extract  Home Medications   Prior to Admission medications   Medication Sig Start Date End Date Taking? Authorizing Provider  ARIPiprazole (ABILIFY) 10 MG tablet Take 10 mg by mouth daily.    Historical Provider, MD  clobetasol cream (TEMOVATE) 0.05 % Apply 1 application topically 2 (two) times daily. Use every other week. 09/22/14   Charm RingsErin J Honig, MD  doxepin (SINEQUAN) 50 MG capsule Take 50 mg by mouth at bedtime.    Historical Provider, MD  ibuprofen (ADVIL,MOTRIN) 800 MG tablet Take 1 tablet (800 mg total) by mouth every 8 (eight) hours as needed for mild pain. 09/22/14   Charm RingsErin J Honig, MD  lamoTRIgine (LAMICTAL) 200 MG tablet Take 2 tablets by mouth daily 08/04/14   Lajean Manesavid Massey, MD   BP 137/88 mmHg  Pulse 114  Temp(Src) 98.3 F (36.8 C) (Oral)  Resp 20  SpO2 97%   Physical Exam  Constitutional: He is oriented to person, place, and time. He appears well-developed and well-nourished.  Appears drowsy but remains awake and alert  HENT:  Head: Normocephalic and atraumatic.  Mouth/Throat: Oropharynx is clear and moist.  Eyes: Conjunctivae and EOM are normal. Pupils are equal, round, and reactive to light.  Pupils dilated but reactive  Neck: Normal range of motion.  Cardiovascular: Regular  rhythm and normal heart sounds.  Tachycardia present.   Pulmonary/Chest: Effort normal and breath sounds normal.  Abdominal: Soft. Bowel sounds are normal.  Musculoskeletal: Normal range of motion.  Neurological: He is alert and oriented to person, place, and time.  Drowsy but awake and alert, able to answer questions and following commands, moving all extremities well  Skin: Skin is warm and dry.  Multiple well healed scars of BUE and chest that appear self inflicted; no open wounds or active bleeding  Psychiatric: He has a normal mood and affect.  Nursing note and vitals reviewed.   ED Course  Procedures (including critical care time) Labs Review Labs  Reviewed  URINE RAPID DRUG SCREEN (HOSP PERFORMED) - Abnormal; Notable for the following:    Tetrahydrocannabinol POSITIVE (*)    All other components within normal limits  ACETAMINOPHEN LEVEL - Abnormal; Notable for the following:    Acetaminophen (Tylenol), Serum <10.0 (*)    All other components within normal limits  CBC WITH DIFFERENTIAL/PLATELET  COMPREHENSIVE METABOLIC PANEL  ETHANOL  SALICYLATE LEVEL    Imaging Review No results found.   EKG Interpretation None      MDM   Final diagnoses:  Overdose, undetermined intent, initial encounter   19 year old male here with drug overdose on Coricidin and Delsym. He denies any suicidal intentions or suicidal thoughts. He is only here because he felt he "overdid it". He does have history of recurrent misuse of OTC cough medications.  On arrival, patient is somewhat drowsy and states he feels dizzy and nauseated.  He is alert and oriented, following commands and answering questions appropriately.  Will obtain labs.  Poison control will be contacted.  6:40 AM Case discussed with poison control, Patty-- drowsiness, agitation, respiratory depression, tachycardia, anticholinergic effects, seizure, HTN.  Too late for charcoal at this time.  Fluids, cardiac monitoring.  6 hours post ingestion.  Antihistamine can slow down gut and prolong symptoms.  Lab work reassuring, tylenol and salicylate levels negative.  Patient was given IV fluids and observed for the recommended 6 hours.  I have checked on him multiple times throughout the day with continued improvement in his symptoms.  He has responded well to fluids and observation, not requiring further intervention.  He has tolerated full meal without difficulty.  Patient fully awake and alert at time of discharge with stable VS.  I had a long discussion with patient about the misuse and abuse of over-the-counter cough medications for uses other than intended and potential bodily harm. Patient  acknowledged these risks. He continues to die any suicidal ideation or that this was any attempt to harm himself.  Encouraged close FU with PCP.  Discussed plan with patient, he/she acknowledged understanding and agreed with plan of care.  Return precautions given for new or worsening symptoms.  Garlon Hatchet, PA-C 10/16/14 1404  Dione Booze, MD 10/16/14 [phone]

## 2014-10-16 NOTE — ED Notes (Signed)
Patient states he took the "triple C's" trying to get high, reporting consumption of cold, cough, and congestion medicine. He reports he had pill and liquid form of the same medication. He took the pills last night at 11pm, and then the liquid form this morning around 5am. Denies this as an active SI attempt.

## 2014-10-30 ENCOUNTER — Encounter (HOSPITAL_COMMUNITY): Payer: Self-pay | Admitting: Psychiatry

## 2014-10-30 ENCOUNTER — Encounter (INDEPENDENT_AMBULATORY_CARE_PROVIDER_SITE_OTHER): Payer: Self-pay

## 2014-10-30 ENCOUNTER — Ambulatory Visit (INDEPENDENT_AMBULATORY_CARE_PROVIDER_SITE_OTHER): Payer: 59 | Admitting: Psychiatry

## 2014-10-30 VITALS — BP 113/68 | HR 81 | Ht 66.5 in | Wt 165.0 lb

## 2014-10-30 DIAGNOSIS — F1920 Other psychoactive substance dependence, uncomplicated: Secondary | ICD-10-CM

## 2014-10-30 DIAGNOSIS — F3162 Bipolar disorder, current episode mixed, moderate: Secondary | ICD-10-CM

## 2014-10-30 DIAGNOSIS — F902 Attention-deficit hyperactivity disorder, combined type: Secondary | ICD-10-CM

## 2014-10-30 DIAGNOSIS — F603 Borderline personality disorder: Secondary | ICD-10-CM

## 2014-10-30 MED ORDER — DOXEPIN HCL 50 MG PO CAPS: 50.0000 mg | ORAL_CAPSULE | Freq: Every day | ORAL | Status: DC

## 2014-10-30 MED ORDER — DOXEPIN HCL 50 MG PO CAPS: 50.0000 mg | ORAL_CAPSULE | Freq: Every day | ORAL | Status: AC

## 2014-10-30 MED ORDER — ARIPIPRAZOLE 15 MG PO TABS: 15.0000 mg | ORAL_TABLET | Freq: Every day | ORAL | Status: AC

## 2014-10-30 MED ORDER — LAMOTRIGINE 150 MG PO TABS: ORAL_TABLET | ORAL | Status: AC

## 2014-10-30 NOTE — Progress Notes (Signed)
Patient ID: Stephen Kidd, male   DOB: [birth year], 19 y.o.   MRN: [id number]  [facility]   Stephen Kidd [id number] 57 y.o.  10/30/2014 2:26 PM  Chief Complaint:  Establish care for medications and depression  History of Present Illness:   Patient Presents for Initial Evaluation with symptoms of self cutting behavior in the past. Patient is a 19 years old currently single Caucasian male who is here with his father. He is part of the rehabilitation facility for the last 2-3 months. Recently has had an incident in which he took some distance and Concerta up to The effect off opium that led him to get confused and dizzy and ended up in the hospital they pumped his stomach says that he was not suicidal he was just trying to get overindulge into cough syrup and cogestants to get opium affect  Patient has been diagnosed with depressive disorder in the past and also ADD. He has been incarcerated in the past and also admitted no hospital because of self cutting behavior. Says that whenever he is mad angry or impulsive he would cut he has had some deep cuts in the past also. Currently is on Lamictal, Abilify, Sinequan. Says that he is doing reasonable his dad said that he has gone a long way and compared to 6 months ago his mood appears to be somewhat better. Still feels down and dysphoric because he is  in a rehabilitation facility and still feels somewhat withdrawn. Occasional crying spells but not suicidal or homicidal. Does not endorse paranoia, psychotic symptoms of hallucinations.  He is also been given a diagnosis of possible bipolar disorder. There is no clear manic symptoms but he remains impulsive at times with poor judgment and irritable mood with increased distractibility and risk taking activities including high indulgences of using drugs.  Regarding his drug habit started at age 47 she started using weed after that started using  other staff. Michela Pitcher that he has experimented with all other possibilities of drugs available and recently in past and he was with his girlfriend he started experimenting with cocaine and then it led to heroine and and it got worse to the point that he was starting using cough syrup to get the same effect.  He does worries that does not endorse excessive worries are unreasonable panic attacks. Denies history of physical or sexual trauma. Says that he grew up with his dad and stepmom his mom . Patient mom left when patient was young. He says that he has 6 sibling younger brothers" towards him and this is the reason that he wants to live and remains to continue to want to begin treatment     Past Psychiatric History/Hospitalization(s) Multiple times because of self cutting behavior. And depression and also do get into sobriety. [facility]. He does endorse sensitive to rejection and impulsivity and self cutting behavior.  Hospitalization for psychiatric illness: Yes History of Electroconvulsive Shock Therapy: No Prior Suicide Attempts: Yes  Medical History; Past Medical History  Diagnosis Date  . ADHD (attention deficit hyperactivity disorder)   . Anxiety     Allergies: Allergies  Allergen Reactions  . Poison Ivy Extract [Extract Of Poison Ivy]     Medications: Outpatient Encounter Prescriptions as of 10/30/2014  Medication Sig  . ARIPiprazole (ABILIFY) 15 MG tablet Take 1 tablet (15 mg total) by mouth daily.  . clobetasol cream (TEMOVATE) 0.05 %  Apply 1 application topically 2 (two) times daily. Use every other week.  . doxepin (SINEQUAN) 50 MG capsule Take 1 capsule (50 mg total) by mouth at bedtime.  Marland Kitchen ibuprofen (ADVIL,MOTRIN) 800 MG tablet Take 1 tablet (800 mg total) by mouth every 8 (eight) hours as needed for mild pain.  Marland Kitchen lamoTRIgine (LAMICTAL) 150 MG tablet Take 2 tablets by mouth daily  . [DISCONTINUED] ARIPiprazole  (ABILIFY) 10 MG tablet Take 10 mg by mouth daily.  . [DISCONTINUED] doxepin (SINEQUAN) 50 MG capsule Take 50 mg by mouth at bedtime.  . [DISCONTINUED] doxepin (SINEQUAN) 50 MG capsule Take 1 capsule (50 mg total) by mouth at bedtime.  . [DISCONTINUED] lamoTRIgine (LAMICTAL) 200 MG tablet Take 2 tablets by mouth daily     Substance Abuse History: Marijuana first use age 73. They're drawn got involved into cocaine recently has used cocaine and heroine and a cup syrup. He has had DUIs in the past currently is not drinking said that he would drink when he is using other drugs benefits available but otherwise states that he is on a regular drinker.  Family History; Family History  Problem Relation Age of Onset  . Diabetes Father   . Depression Sister   . Cancer Paternal Grandmother     hx. of breast cancer  . Depression Mother   . Anxiety disorder Mother   . Stroke Paternal Grandfather   . Transient ischemic attack Paternal Grandfather   . Alcohol abuse Neg Hx   . Physical abuse Neg Hx   . Bipolar disorder Neg Hx       Biopsychosocial History:  [name]'s father his mom left him. Patient also has a stepmom. He has 6 young sibling brothers. Currently he is in a rehabilitation facility for the last 2 months he has to complete 9 months. He has had a DUI in the past and also jail time for stealing money    Labs: reveiwed from last ED visit. Recent Results (from the past 2160 hour(s))  CBC with Differential     Status: Abnormal   Collection Time: 09/15/14  9:55 AM  Result Value Ref Range   WBC 10.9 (H) 4.0 - 10.5 K/uL   RBC 5.10 4.22 - 5.81 MIL/uL   Hemoglobin 15.4 13.0 - 17.0 g/dL   HCT 33.2 83.5 - 59.6 %   MCV 86.3 78.0 - 100.0 fL   MCH 30.2 26.0 - 34.0 pg   MCHC 35.0 30.0 - 36.0 g/dL   RDW 19.9 12.0 - 57.1 %   Platelets 235 150 - 400 K/uL   Neutrophils Relative % 83 (H) 43 - 77 %   Neutro Abs 9.0 (H) 1.7 - 7.7 K/uL   Lymphocytes Relative 12 12 - 46 %   Lymphs Abs 1.3 0.7 -  4.0 K/uL   Monocytes Relative 5 3 - 12 %   Monocytes Absolute 0.5 0.1 - 1.0 K/uL   Eosinophils Relative 0 0 - 5 %   Eosinophils Absolute 0.0 0.0 - 0.7 K/uL   Basophils Relative 0 0 - 1 %   Basophils Absolute 0.0 0.0 - 0.1 K/uL  Basic metabolic panel     Status: None   Collection Time: 09/15/14  9:55 AM  Result Value Ref Range   Sodium 139 135 - 145 mmol/L   Potassium 3.6 3.5 - 5.1 mmol/L   Chloride 107 96 - 112 mmol/L   CO2 26 19 - 32 mmol/L   Glucose, Bld 99 70 - 99 mg/dL  BUN 8 6 - 23 mg/dL   Creatinine, Ser 1.15 0.50 - 1.35 mg/dL   Calcium 9.5 8.4 - 88.6 mg/dL   GFR calc non Af Amer >90 >90 mL/min   GFR calc Af Amer >90 >90 mL/min    Comment: (NOTE) The eGFR has been calculated using the CKD EPI equation. This calculation has not been validated in all clinical situations. eGFR's persistently <90 mL/min signify possible Chronic Kidney Disease.    Anion gap 6 5 - 15  Urinalysis, Routine w reflex microscopic     Status: None   Collection Time: 09/15/14 10:09 AM  Result Value Ref Range   Color, Urine YELLOW YELLOW   APPearance CLEAR CLEAR   Specific Gravity, Urine 1.009 1.005 - 1.030   pH 7.0 5.0 - 8.0   Glucose, UA NEGATIVE NEGATIVE mg/dL   Hgb urine dipstick NEGATIVE NEGATIVE   Bilirubin Urine NEGATIVE NEGATIVE   Ketones, ur NEGATIVE NEGATIVE mg/dL   Protein, ur NEGATIVE NEGATIVE mg/dL   Urobilinogen, UA 0.2 0.0 - 1.0 mg/dL   Nitrite NEGATIVE NEGATIVE   Leukocytes, UA NEGATIVE NEGATIVE    Comment: MICROSCOPIC NOT DONE ON URINES WITH NEGATIVE PROTEIN, BLOOD, LEUKOCYTES, NITRITE, OR GLUCOSE <1000 mg/dL.  I-stat troponin, ED     Status: None   Collection Time: 09/15/14 10:21 AM  Result Value Ref Range   Troponin i, poc 0.00 0.00 - 0.08 ng/mL   Comment 3            Comment: Due to the release kinetics of cTnI, a negative result within the first hours of the onset of symptoms does not rule out myocardial infarction with certainty. If myocardial infarction is still  suspected, repeat the test at appropriate intervals.   D-dimer, quantitative     Status: None   Collection Time: 09/15/14 11:48 AM  Result Value Ref Range   D-Dimer, Quant <0.27 0.00 - 0.48 ug/mL-FEU    Comment:        AT THE INHOUSE ESTABLISHED CUTOFF VALUE OF 0.48 ug/mL FEU, THIS ASSAY HAS BEEN DOCUMENTED IN THE LITERATURE TO HAVE A SENSITIVITY AND NEGATIVE PREDICTIVE VALUE OF AT LEAST 98 TO 99%.  THE TEST RESULT SHOULD BE CORRELATED WITH AN ASSESSMENT OF THE CLINICAL PROBABILITY OF DVT / VTE.   CBC with Differential     Status: None   Collection Time: 10/16/14  6:40 AM  Result Value Ref Range   WBC 7.4 4.0 - 10.5 K/uL   RBC 5.40 4.22 - 5.81 MIL/uL   Hemoglobin 16.7 13.0 - 17.0 g/dL   HCT 31.2 45.2 - 52.5 %   MCV 88.5 78.0 - 100.0 fL   MCH 30.9 26.0 - 34.0 pg   MCHC 34.9 30.0 - 36.0 g/dL   RDW 58.2 81.5 - 63.1 %   Platelets 229 150 - 400 K/uL   Neutrophils Relative % 62 43 - 77 %   Neutro Abs 4.6 1.7 - 7.7 K/uL   Lymphocytes Relative 26 12 - 46 %   Lymphs Abs 1.9 0.7 - 4.0 K/uL   Monocytes Relative 12 3 - 12 %   Monocytes Absolute 0.9 0.1 - 1.0 K/uL   Eosinophils Relative 0 0 - 5 %   Eosinophils Absolute 0.0 0.0 - 0.7 K/uL   Basophils Relative 0 0 - 1 %   Basophils Absolute 0.0 0.0 - 0.1 K/uL  Comprehensive metabolic panel     Status: None   Collection Time: 10/16/14  6:40 AM  Result Value Ref Range  Sodium 142 135 - 145 mmol/L   Potassium 3.6 3.5 - 5.1 mmol/L   Chloride 106 96 - 112 mmol/L   CO2 27 19 - 32 mmol/L   Glucose, Bld 94 70 - 99 mg/dL   BUN 9 6 - 23 mg/dL   Creatinine, Ser 1.07 0.50 - 1.35 mg/dL   Calcium 9.9 8.4 - 10.5 mg/dL   Total Protein 7.3 6.0 - 8.3 g/dL   Albumin 4.9 3.5 - 5.2 g/dL   AST 17 0 - 37 U/L   ALT 10 0 - 53 U/L   Alkaline Phosphatase 68 39 - 117 U/L   Total Bilirubin 1.0 0.3 - 1.2 mg/dL   GFR calc non Af Amer >90 >90 mL/min   GFR calc Af Amer >90 >90 mL/min    Comment: (NOTE) The eGFR has been calculated using the CKD EPI  equation. This calculation has not been validated in all clinical situations. eGFR's persistently <90 mL/min signify possible Chronic Kidney Disease.    Anion gap 9 5 - 15  Ethanol     Status: None   Collection Time: 10/16/14  6:40 AM  Result Value Ref Range   Alcohol, Ethyl (B) <5 0 - 9 mg/dL    Comment:        LOWEST DETECTABLE LIMIT FOR SERUM ALCOHOL IS 11 mg/dL FOR MEDICAL PURPOSES ONLY   Acetaminophen level     Status: Abnormal   Collection Time: 10/16/14  6:40 AM  Result Value Ref Range   Acetaminophen (Tylenol), Serum <10.0 (L) 10 - 30 ug/mL    Comment:        THERAPEUTIC CONCENTRATIONS VARY SIGNIFICANTLY. A RANGE OF 10-30 ug/mL MAY BE AN EFFECTIVE CONCENTRATION FOR MANY PATIENTS. HOWEVER, SOME ARE BEST TREATED AT CONCENTRATIONS OUTSIDE THIS RANGE. ACETAMINOPHEN CONCENTRATIONS >150 ug/mL AT 4 HOURS AFTER INGESTION AND >50 ug/mL AT 12 HOURS AFTER INGESTION ARE OFTEN ASSOCIATED WITH TOXIC REACTIONS.   Salicylate level     Status: None   Collection Time: 10/16/14  6:40 AM  Result Value Ref Range   Salicylate Lvl <0.2 2.8 - 20.0 mg/dL  Urine rapid drug screen (hosp performed)     Status: Abnormal   Collection Time: 10/16/14  7:46 AM  Result Value Ref Range   Opiates NONE DETECTED NONE DETECTED   Cocaine NONE DETECTED NONE DETECTED   Benzodiazepines NONE DETECTED NONE DETECTED   Amphetamines NONE DETECTED NONE DETECTED   Tetrahydrocannabinol POSITIVE (A) NONE DETECTED   Barbiturates NONE DETECTED NONE DETECTED    Comment:        DRUG SCREEN FOR MEDICAL PURPOSES ONLY.  IF CONFIRMATION IS NEEDED FOR ANY PURPOSE, NOTIFY LAB WITHIN 5 DAYS.        LOWEST DETECTABLE LIMITS FOR URINE DRUG SCREEN Drug Class       Cutoff (ng/mL) Amphetamine      1000 Barbiturate      200 Benzodiazepine   725 Tricyclics       366 Opiates          300 Cocaine          300 THC              50        Musculoskeletal: Strength & Muscle Tone: within normal limits Gait &  Station: normal Patient leans: N/A  Mental Status Examination;   Psychiatric Specialty Exam: Physical Exam  Constitutional: He appears well-developed and well-nourished.    Review of Systems  Constitutional: Negative.   Gastrointestinal: Negative for  nausea.  Skin: Negative for rash.  Psychiatric/Behavioral: Positive for substance abuse. Negative for depression and hallucinations. The patient has insomnia.     Blood pressure 113/68, pulse 81, height 5' 6.5" (1.689 m), weight 165 lb (74.844 kg).Body mass index is 26.24 kg/(m^2).  General Appearance: Casual  Eye Contact::  Fair  Speech:  Slow  Volume:  Decreased  Mood:  Euthymic  Affect:  Constricted  Thought Process:  Coherent  Orientation:  Full (Time, Place, and Person)  Thought Content:  Rumination  Suicidal Thoughts:  No  Homicidal Thoughts:  No  Memory:  Immediate;   Fair Recent;   Fair  Judgement:  Poor  Insight:  Shallow  Psychomotor Activity:  Normal  Concentration:  Fair  Recall:  Fair  Akathisia:  Negative  Handed:  Right  AIMS (if indicated):     Assets:  Communication Skills Desire for Improvement Physical Health  Sleep:        Assessment: Axis I: Bipolar disorder mixed episode. Polysubstance dependence. ADD. History  Axis II: Borderline personality disorder  Axis III:  Past Medical History  Diagnosis Date  . ADHD (attention deficit hyperactivity disorder)   . Anxiety     Axis IV: Psychosocial, legal   Treatment Plan and Summary: Increased mood stabilizer Abilify to 15 mg. Continue Lamictal 150 mg twice a day. Continue Sinequan 50 mg at night I highly recommend that he would benefit from therapy considering his impulsivity. Continue the rehabilitation facility as of now for his drug dependency. Pertinent Labs and Relevant Prior Notes reviewed. Medication Side effects, benefits and risks reviewed/discussed with Patient. Time given for patient to respond and asks questions regarding the  Diagnosis and Medications. Safety concerns and to report to ER if suicidal or call 911. Relevant Medications refilled or called in to pharmacy. Discussed weight maintenance and Sleep Hygiene. Follow up with Primary care provider in regards to Medical conditions. Recommend compliance with medications and follow up office appointments. Discussed to avail opportunity to consider or/and continue Individual therapy with Counselor. Greater than 50% of time was spend in counseling and coordination of care with the patient.  Schedule for Follow up visit in 4 weeks or call in earlier as necessary.   Merian Capron, MD 10/30/2014

## 2014-11-02 ENCOUNTER — Encounter (HOSPITAL_COMMUNITY): Payer: Self-pay | Admitting: Emergency Medicine

## 2014-11-02 ENCOUNTER — Emergency Department (HOSPITAL_COMMUNITY)
Admission: EM | Admit: 2014-11-02 | Discharge: 2014-11-02 | Disposition: A | Discharge status: Other Acute Inpatient Hospital | Payer: BLUE CROSS/BLUE SHIELD | Attending: Emergency Medicine | Admitting: Emergency Medicine

## 2014-11-02 DIAGNOSIS — Z7952 Long term (current) use of systemic steroids: Secondary | ICD-10-CM | POA: Insufficient documentation

## 2014-11-02 DIAGNOSIS — T483X2A Poisoning by antitussives, intentional self-harm, initial encounter: Secondary | ICD-10-CM | POA: Insufficient documentation

## 2014-11-02 DIAGNOSIS — R45851 Suicidal ideations: Secondary | ICD-10-CM | POA: Diagnosis present

## 2014-11-02 DIAGNOSIS — Z79899 Other long term (current) drug therapy: Secondary | ICD-10-CM | POA: Insufficient documentation

## 2014-11-02 DIAGNOSIS — T450X2A Poisoning by antiallergic and antiemetic drugs, intentional self-harm, initial encounter: Secondary | ICD-10-CM | POA: Insufficient documentation

## 2014-11-02 DIAGNOSIS — Z720 Tobacco use: Secondary | ICD-10-CM | POA: Insufficient documentation

## 2014-11-02 LAB — CBC WITH DIFFERENTIAL/PLATELET
BASOS ABS: 0 10*3/uL (ref 0.0–0.1)
Basophils Relative: 0 % (ref 0–1)
Eosinophils Absolute: 0 10*3/uL (ref 0.0–0.7)
Eosinophils Relative: 0 % (ref 0–5)
HCT: 44.6 % (ref 39.0–52.0)
Hemoglobin: 15.3 g/dL (ref 13.0–17.0)
LYMPHS ABS: 2.4 10*3/uL (ref 0.7–4.0)
Lymphocytes Relative: 27 % (ref 12–46)
MCH: 30.1 pg (ref 26.0–34.0)
MCHC: 34.3 g/dL (ref 30.0–36.0)
MCV: 87.8 fL (ref 78.0–100.0)
Monocytes Absolute: 0.8 10*3/uL (ref 0.1–1.0)
Monocytes Relative: 9 % (ref 3–12)
Neutro Abs: 5.8 10*3/uL (ref 1.7–7.7)
Neutrophils Relative %: 64 % (ref 43–77)
Platelets: 230 10*3/uL (ref 150–400)
RBC: 5.08 MIL/uL (ref 4.22–5.81)
RDW: 12.2 % (ref 11.5–15.5)
WBC: 9 10*3/uL (ref 4.0–10.5)

## 2014-11-02 LAB — COMPREHENSIVE METABOLIC PANEL
ALBUMIN: 4.8 g/dL (ref 3.5–5.2)
ALT: 11 U/L (ref 0–53)
AST: 18 U/L (ref 0–37)
Alkaline Phosphatase: 73 U/L (ref 39–117)
Anion gap: 6 (ref 5–15)
BILIRUBIN TOTAL: 0.7 mg/dL (ref 0.3–1.2)
BUN: 12 mg/dL (ref 6–23)
CO2: 26 mmol/L (ref 19–32)
Calcium: 9.6 mg/dL (ref 8.4–10.5)
Chloride: 106 mmol/L (ref 96–112)
Creatinine, Ser: 1.01 mg/dL (ref 0.50–1.35)
GFR calc Af Amer: >90 mL/min (ref >90)
GFR calc non Af Amer: >90 mL/min (ref >90)
GLUCOSE: 114 mg/dL — AB (ref 70–99)
Potassium: 3.4 mmol/L — ABNORMAL LOW (ref 3.5–5.1)
Sodium: 138 mmol/L (ref 135–145)
Total Protein: 7.5 g/dL (ref 6.0–8.3)

## 2014-11-02 LAB — RAPID URINE DRUG SCREEN, HOSP PERFORMED
Amphetamines: NOT DETECTED
BARBITURATES: NOT DETECTED
BENZODIAZEPINES: NOT DETECTED
COCAINE: NOT DETECTED
Opiates: NOT DETECTED
TETRAHYDROCANNABINOL: POSITIVE — AB

## 2014-11-02 LAB — ETHANOL

## 2014-11-02 LAB — ACETAMINOPHEN LEVEL: Acetaminophen (Tylenol), Serum: <10 ug/mL — ABNORMAL LOW (ref 10–30)

## 2014-11-02 LAB — SALICYLATE LEVEL

## 2014-11-02 MED ORDER — ONDANSETRON HCL 4 MG/2ML IJ SOLN
4.0000 mg | Freq: Once | INTRAMUSCULAR | Status: AC
  Administered 2014-11-02: 4 mg via INTRAVENOUS
  Filled 2014-11-02: qty 2

## 2014-11-02 MED ORDER — IBUPROFEN 200 MG PO TABS
600.0000 mg | ORAL_TABLET | Freq: Three times a day (TID) | ORAL | Status: DC | PRN
  Administered 2014-11-02: 600 mg via ORAL
  Filled 2014-11-02: qty 3

## 2014-11-02 MED ORDER — ZOLPIDEM TARTRATE 5 MG PO TABS
5.0000 mg | ORAL_TABLET | Freq: Every evening | ORAL | Status: DC | PRN
Start: 2014-11-02 — End: 2014-11-02
  Administered 2014-11-02: 5 mg via ORAL
  Filled 2014-11-02: qty 1

## 2014-11-02 MED ORDER — POTASSIUM CHLORIDE CRYS ER 20 MEQ PO TBCR
40.0000 meq | EXTENDED_RELEASE_TABLET | Freq: Once | ORAL | Status: AC
  Administered 2014-11-02: 40 meq via ORAL
  Filled 2014-11-02: qty 2

## 2014-11-02 MED ORDER — ALUM & MAG HYDROXIDE-SIMETH 200-200-20 MG/5ML PO SUSP
30.0000 mL | ORAL | Status: DC | PRN
  Administered 2014-11-02: 30 mL via ORAL
  Filled 2014-11-02: qty 30

## 2014-11-02 MED ORDER — ONDANSETRON HCL 4 MG PO TABS: 4.0000 mg | ORAL_TABLET | Freq: Three times a day (TID) | ORAL | Status: DC | PRN

## 2014-11-02 MED ORDER — NICOTINE 21 MG/24HR TD PT24
21.0000 mg | MEDICATED_PATCH | Freq: Every day | TRANSDERMAL | Status: DC
  Administered 2014-11-02: 21 mg via TRANSDERMAL
  Filled 2014-11-02: qty 1

## 2014-11-02 NOTE — ED Notes (Signed)
Report given to [facility]-per Kristen, no paper needed-[facility]

## 2014-11-02 NOTE — ED Notes (Addendum)
[facility] -Watch for anticholinergic effects -QRS widening -urinary retention -Monitor-CNS/Respiratory depression -Nystagmus -Hypertension -Supportive care -Give IV fluids -Benzo's for agitation/seizures -Treat QRS widening with bicarb bolus

## 2014-11-02 NOTE — ED Notes (Signed)
Sitter at bedside.
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
## 2014-11-02 NOTE — ED Notes (Signed)
Bed: RESB Expected date:  Expected time:  Means of arrival:  Comments: EMS overdose 

## 2014-11-02 NOTE — ED Notes (Signed)
Almira CoasterGina from poison control [phone] or [phone]) says patient time is up. Poison control says to make sure patient asymptomatic.
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
## 2014-11-02 NOTE — ED Notes (Signed)
[facility] called [facility]

## 2014-11-02 NOTE — BH Assessment (Signed)
Per Deana at [facility] pt has been accepted and can arrive after 9 am. Accepting doctor is Dr. Dierdre ForthGacengececi. Report to be called to [phone].   Informed Linna CapriceJaneen Nash, RN of pt acceptance.   Clista BernhardtNancy Kimerly Rowand, [facility] Triage Specialist 11/02/2014 5:03 AM

## 2014-11-02 NOTE — BH Assessment (Signed)
Spoke with Dr. Preston FleetingGlick prior to initiating assessment. Per Dr. Preston FleetingGlick pt overdosed and is reporting he continues to have suicidal ideation. Pt reports he took a bottle of tylenol but did not test positive for tylenol. He is still under observation per poison control but can be assessed.   Requested cart be placed with pt for assessment.   Assessment to commence shortly.    Clista BernhardtNancy Sid Greener, [facility] Triage Specialist 11/02/2014 2:48 AM

## 2014-11-02 NOTE — BH Assessment (Signed)
Seeking placement as pt meets inpt and there are currently no appropriate [facility] beds. [facility]: [facility], [facility], [facility], [facility]    Finnley Lewis, [facility] Triage Specialist 11/02/2014 4:14 AM

## 2014-11-02 NOTE — ED Notes (Addendum)
Per Poison Center/monitor for at least 6 hours minimum

## 2014-11-02 NOTE — ED Notes (Signed)
Per EMS: Pt suicidal. Pt overdosed about 3 hours ago on 200 pills of Tylenol (unknown strength), a bottle of Benadryl, bottle of Delysum cough syrup, "triple C's", 2 40 oz bottles of beer, and marijuana. Pt A&O x 4 at this time. LLQ abdomen tender to palpation, onset after overdose.

## 2014-11-28 ENCOUNTER — Ambulatory Visit (HOSPITAL_COMMUNITY): Payer: Self-pay | Admitting: Psychiatry
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
## 2015-09-24 IMAGING — DX DG CHEST 2V
2 series · 2 of 2 positions shown · non-contrast
Comparison: None.

CLINICAL DATA: Syncopal episode at work today. Left-sided chest
pain.

EXAM:
CHEST  2 VIEW

[chest pa]
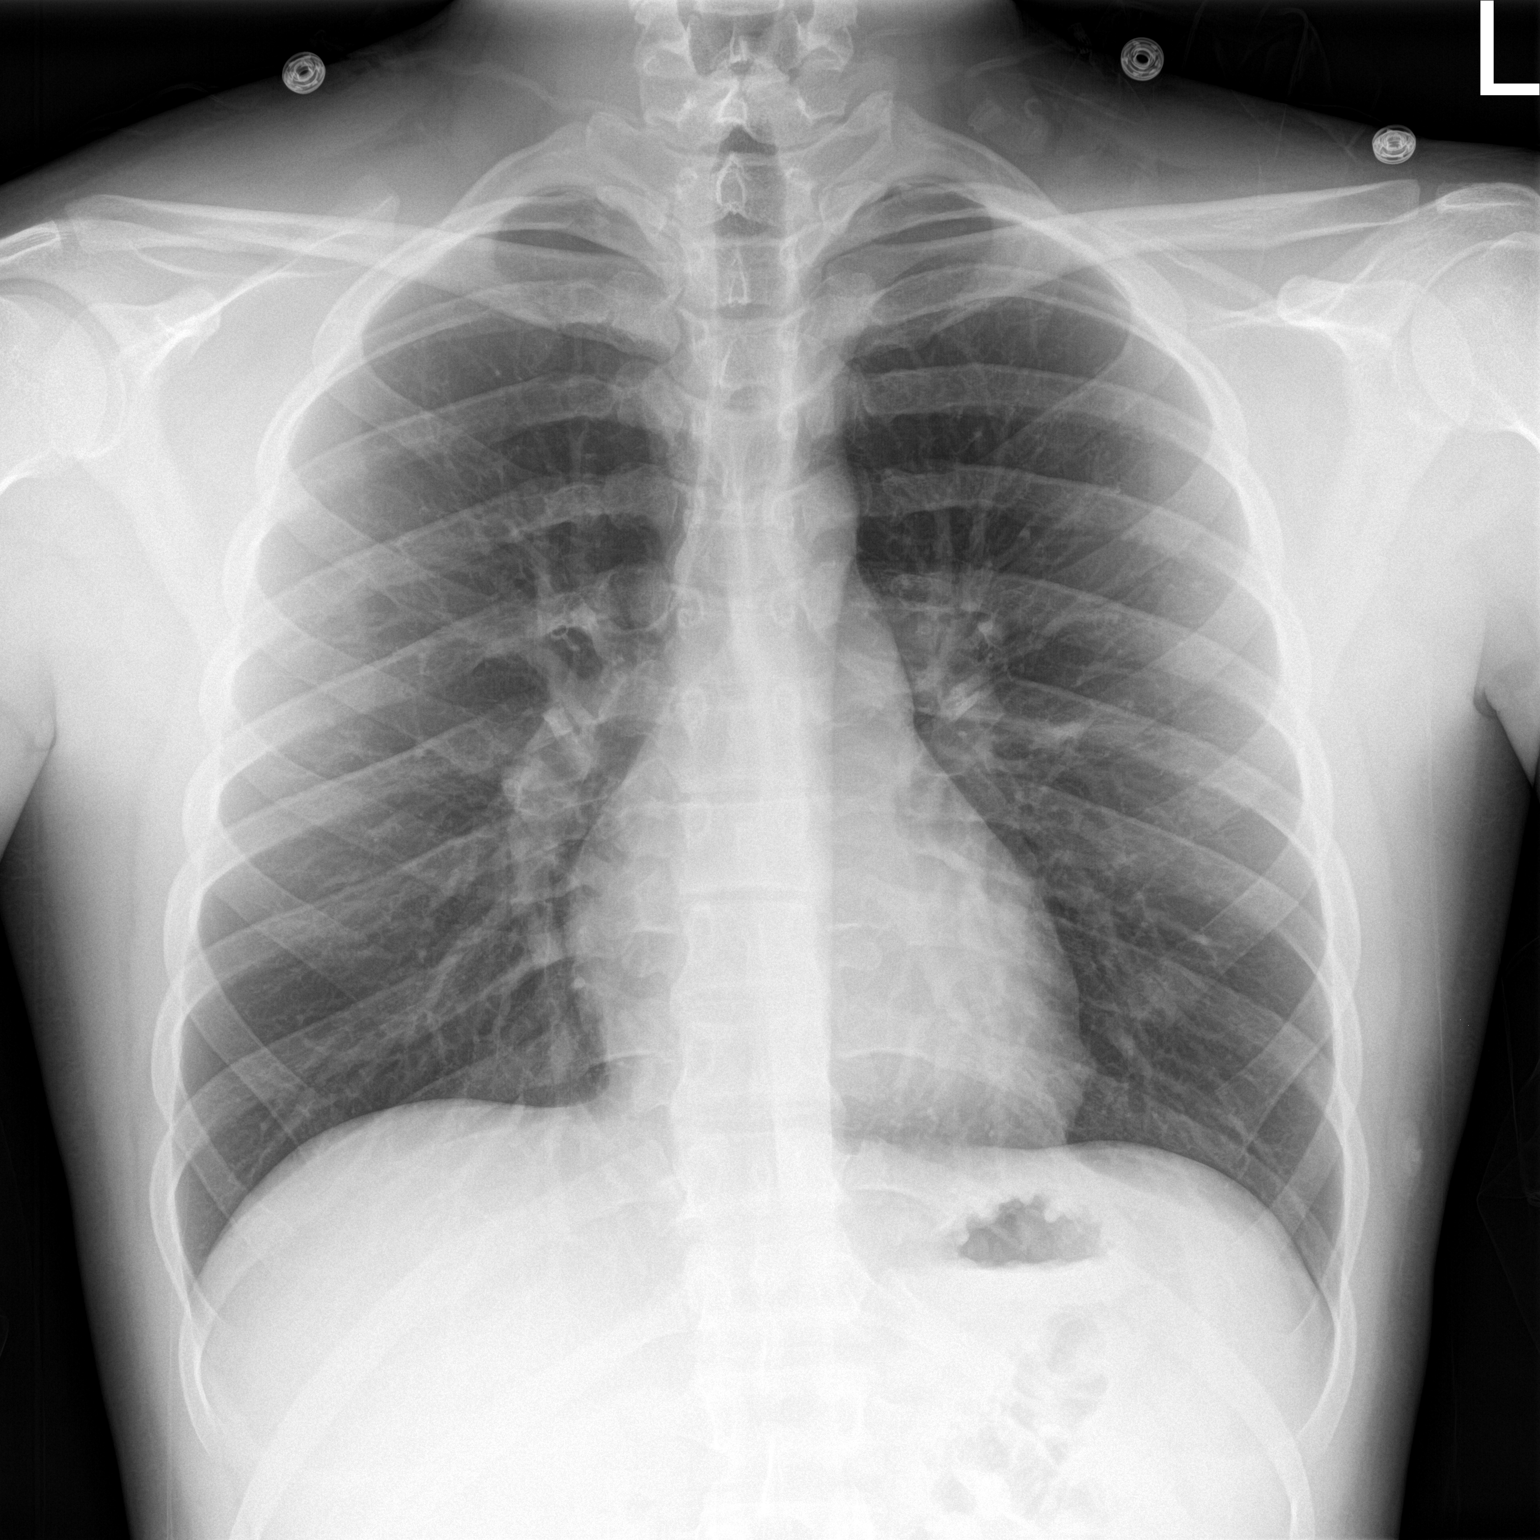

[chest lat]
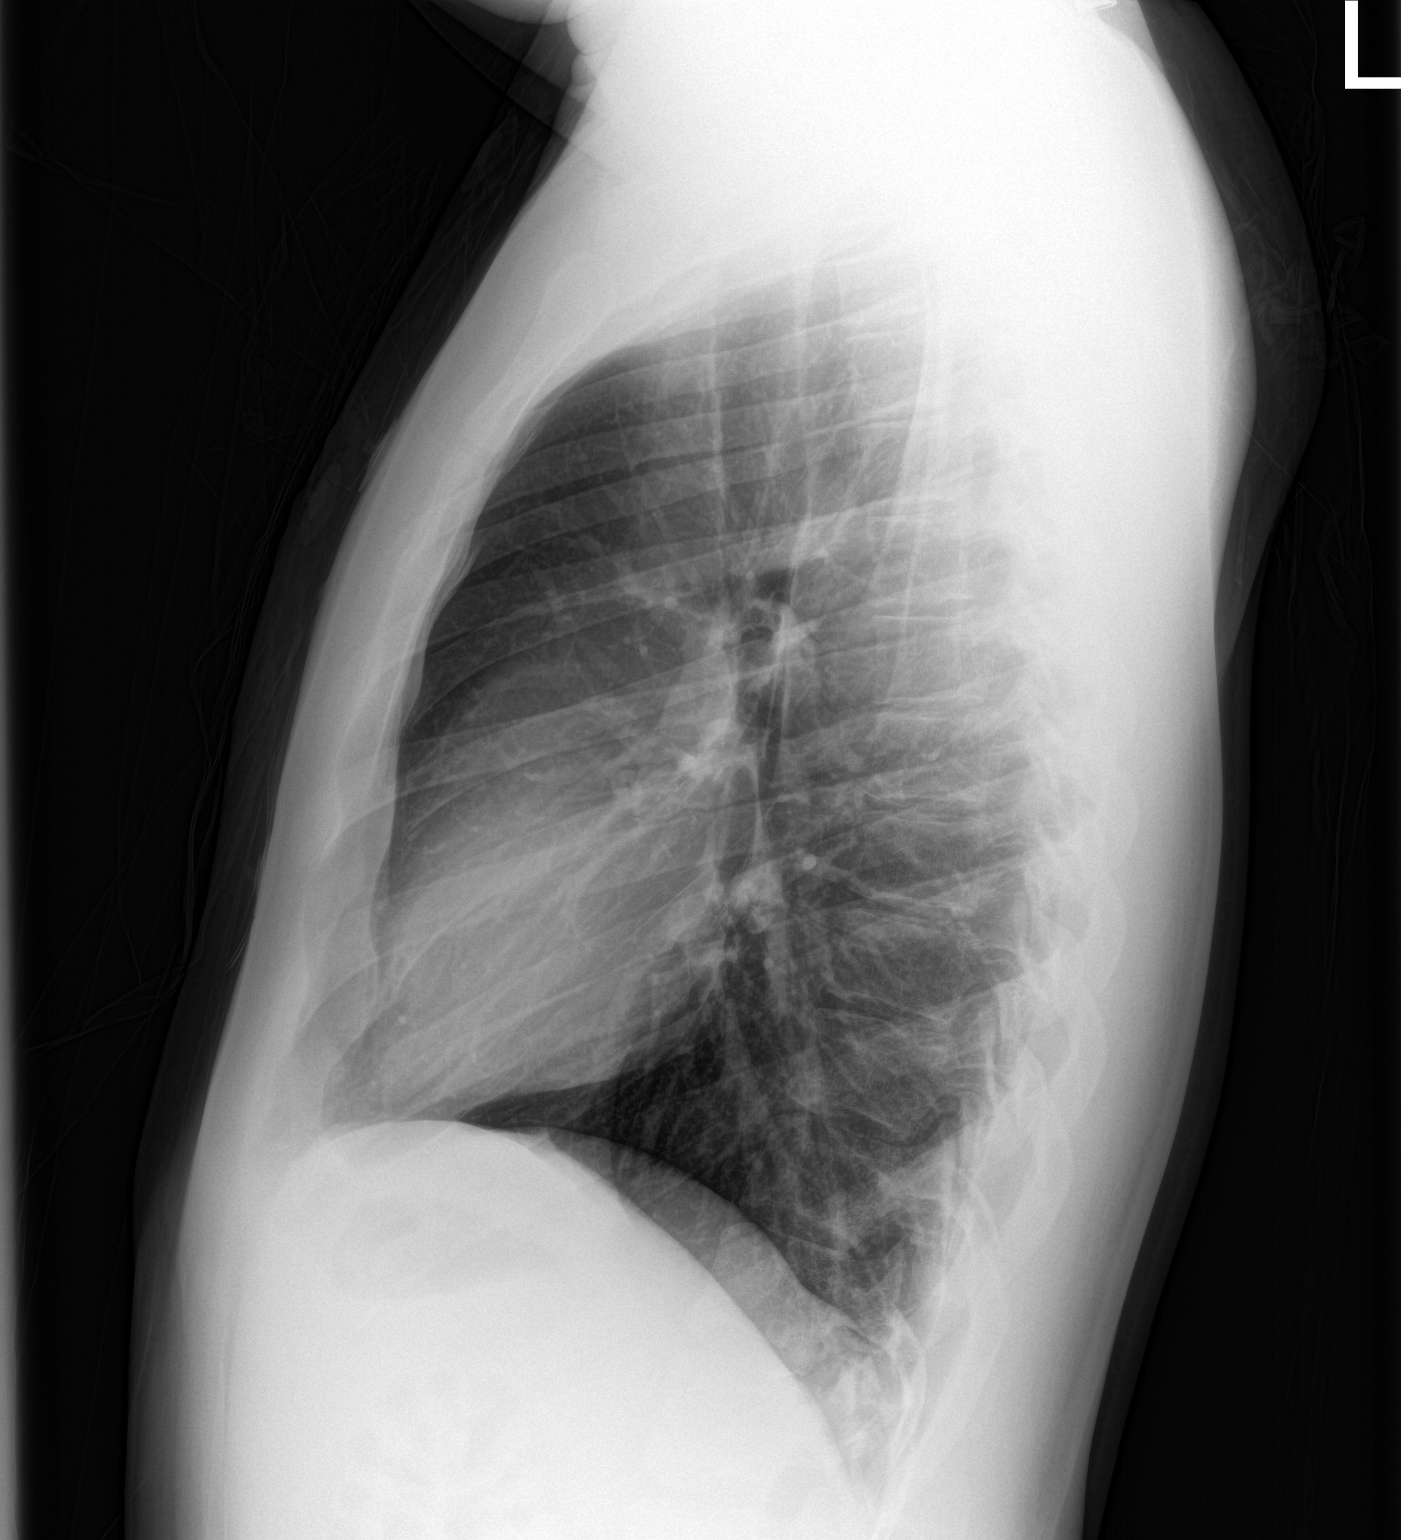

[2 of 2 positions shown; findings below may reference images not displayed]

FINDINGS: The heart size and mediastinal contours are within normal limits.
Both lungs are clear. The visualized skeletal structures are
unremarkable.
IMPRESSION: Normal chest x-ray.

## 2020-06-25 ENCOUNTER — Emergency Department: Admission: EM | Admit: 2020-06-25 | Discharge: 2020-06-25 | Discharge status: Absent without leave | Payer: Self-pay
